# Patient Record
Sex: Female | Born: 1975
Health system: Southern US, Community
[De-identification: ages and names within clinical notes are randomized; demographics above are authoritative.]

---

## 2003-10-01 ENCOUNTER — Other Ambulatory Visit: Admission: RE | Admit: 2003-10-01 | Discharge: 2003-10-01 | Payer: Self-pay | Admitting: Obstetrics and Gynecology

## 2004-01-25 ENCOUNTER — Encounter (INDEPENDENT_AMBULATORY_CARE_PROVIDER_SITE_OTHER): Payer: Self-pay | Admitting: *Deleted

## 2004-01-25 ENCOUNTER — Ambulatory Visit (HOSPITAL_COMMUNITY): Admission: RE | Admit: 2004-01-25 | Discharge: 2004-01-25 | Payer: Self-pay | Admitting: Obstetrics and Gynecology

## 2004-04-02 ENCOUNTER — Other Ambulatory Visit: Admission: RE | Admit: 2004-04-02 | Discharge: 2004-04-02 | Payer: Self-pay | Admitting: Obstetrics and Gynecology

## 2004-12-12 ENCOUNTER — Other Ambulatory Visit: Admission: RE | Admit: 2004-12-12 | Discharge: 2004-12-12 | Payer: Self-pay | Admitting: Obstetrics and Gynecology

## 2005-08-10 ENCOUNTER — Inpatient Hospital Stay (HOSPITAL_COMMUNITY): Admission: RE | Admit: 2005-08-10 | Discharge: 2005-08-13 | Payer: Self-pay | Admitting: Obstetrics and Gynecology

## 2005-08-14 ENCOUNTER — Encounter: Admission: RE | Admit: 2005-08-14 | Discharge: 2005-08-26 | Payer: Self-pay | Admitting: Obstetrics and Gynecology

## 2006-07-09 ENCOUNTER — Encounter: Admission: RE | Admit: 2006-07-09 | Discharge: 2006-07-09 | Payer: Self-pay | Admitting: Family Medicine

## 2007-01-17 ENCOUNTER — Encounter: Admission: RE | Admit: 2007-01-17 | Discharge: 2007-01-17 | Payer: Self-pay | Admitting: Family Medicine

## 2010-08-13 ENCOUNTER — Encounter (HOSPITAL_COMMUNITY): Payer: 59

## 2010-08-13 ENCOUNTER — Ambulatory Visit (HOSPITAL_COMMUNITY)
Admission: RE | Admit: 2010-08-13 | Discharge: 2010-08-13 | Disposition: A | Discharge status: Home / Self Care | Payer: 59 | Source: Ambulatory Visit | Attending: Orthopedic Surgery | Admitting: Orthopedic Surgery

## 2010-08-13 DIAGNOSIS — Z01818 Encounter for other preprocedural examination: Secondary | ICD-10-CM | POA: Insufficient documentation

## 2010-08-13 DIAGNOSIS — M5126 Other intervertebral disc displacement, lumbar region: Secondary | ICD-10-CM | POA: Insufficient documentation

## 2010-08-13 LAB — PREGNANCY, URINE: Preg Test, Ur: NEGATIVE

## 2010-08-13 LAB — CBC
HCT: 37.8 % (ref 36.0–46.0)
Hemoglobin: 12.9 g/dL (ref 12.0–15.0)
MCH: 31.3 pg (ref 26.0–34.0)
MCV: 91.7 fL (ref 78.0–100.0)
RBC: 4.12 MIL/uL (ref 3.87–5.11)

## 2010-08-13 LAB — COMPREHENSIVE METABOLIC PANEL
AST: 17 U/L (ref 0–37)
Albumin: 3.6 g/dL (ref 3.5–5.2)
Alkaline Phosphatase: 54 U/L (ref 39–117)
CO2: 25 mEq/L (ref 19–32)
Chloride: 103 mEq/L (ref 96–112)
Potassium: 3.9 mEq/L (ref 3.5–5.1)

## 2010-08-13 LAB — SURGICAL PCR SCREEN: Staphylococcus aureus: NEGATIVE

## 2010-08-13 LAB — DIFFERENTIAL
Eosinophils Absolute: 0.2 10*3/uL (ref 0.0–0.7)
Eosinophils Relative: 2 % (ref 0–5)
Lymphocytes Relative: 21 % (ref 12–46)
Monocytes Absolute: 0.5 10*3/uL (ref 0.1–1.0)
Neutro Abs: 4.9 10*3/uL (ref 1.7–7.7)

## 2010-08-13 LAB — URINALYSIS, ROUTINE W REFLEX MICROSCOPIC
Bilirubin Urine: NEGATIVE
Ketones, ur: NEGATIVE mg/dL
Protein, ur: NEGATIVE mg/dL
Specific Gravity, Urine: 1.008 (ref 1.005–1.030)
Urine Glucose, Fasting: NEGATIVE mg/dL
Urobilinogen, UA: 0.2 mg/dL (ref 0.0–1.0)

## 2010-08-14 ENCOUNTER — Other Ambulatory Visit: Payer: Self-pay | Admitting: Obstetrics and Gynecology

## 2010-08-20 ENCOUNTER — Observation Stay (HOSPITAL_COMMUNITY)
Admission: RE | Admit: 2010-08-20 | Discharge: 2010-08-21 | Disposition: A | Discharge status: Home / Self Care | Payer: 59 | Attending: Orthopedic Surgery | Admitting: Orthopedic Surgery

## 2010-08-20 ENCOUNTER — Other Ambulatory Visit (HOSPITAL_COMMUNITY): Payer: Self-pay | Admitting: Orthopedic Surgery

## 2010-08-20 ENCOUNTER — Ambulatory Visit (HOSPITAL_COMMUNITY)
Admission: RE | Admit: 2010-08-20 | Discharge: 2010-08-20 | Disposition: A | Discharge status: Home / Self Care | Payer: 59 | Source: Ambulatory Visit | Attending: Orthopedic Surgery | Admitting: Orthopedic Surgery

## 2010-08-20 ENCOUNTER — Other Ambulatory Visit: Payer: Self-pay | Admitting: Orthopedic Surgery

## 2010-08-20 DIAGNOSIS — M549 Dorsalgia, unspecified: Secondary | ICD-10-CM

## 2010-08-20 DIAGNOSIS — M545 Low back pain, unspecified: Secondary | ICD-10-CM | POA: Insufficient documentation

## 2010-08-20 DIAGNOSIS — M5126 Other intervertebral disc displacement, lumbar region: Principal | ICD-10-CM | POA: Insufficient documentation

## 2010-08-20 DIAGNOSIS — M48061 Spinal stenosis, lumbar region without neurogenic claudication: Secondary | ICD-10-CM | POA: Insufficient documentation

## 2010-08-20 LAB — ABO/RH: ABO/RH(D): A POS

## 2010-08-20 LAB — TYPE AND SCREEN: Antibody Screen: NEGATIVE

## 2010-08-26 NOTE — Op Note (Signed)
[name], [name]          ACCOUNT NO.:  [id number]  MEDICAL RECORD NO.:  [id number]          PATIENT TYPE:  OBV  LOCATION:  1343                          FACILITY:  [facility]  PHYSICIAN:  Georges Lynch. Shenica Holzheimer, M.D.DATE OF BIRTH:  Apr 06, 1976  DATE OF PROCEDURE:  08/20/2010 DATE OF DISCHARGE:                              OPERATIVE REPORT   SURGEON:  Georges Lynch. Darrelyn Hillock, M.D.  ASSISTANT:  Marlowe Kays, M.D.  PREOPERATIVE DIAGNOSIS:  Bilateral herniated lumbar disk at L5-S1 with recess stenosis at L5-S1 bilaterally.  She had severe right leg pain which finally converted the bilateral leg pain preop.  POSTOPERATIVE DIAGNOSIS:  Bilateral herniated lumbar disk at L5-S1 with recess stenosis at L5-S1 bilaterally.  She had severe right leg pain which finally converted the bilateral leg pain preop.  OPERATION: 1. Bilateral decompression of the lateral recess of L5-S1. 2. Bilateral microdiskectomy at L5-S1.  PROCEDURE:  Under general anesthesia, routine orthopedic prep and draping of lower back was carried out.  Note, I did mark the right side of her back preop because that is where most of her symptoms were.  In the operating room prior to surgery, we did do the appropriate time-out. After sterile prep and drape was carried out with the patient on the spinal frame and the x-rays were up as well as the MRI, I had put 2 needles on the back for localization purposes.  At that time, we identified the L5-S1 space.  An incision was made over L5-S1.  Bleeders identified and cauterized.  The incision then was carried down to the lumbodorsal fascia.  We separated the muscle from the lamina and spinous processes bilaterally.  The McCullough retractor was inserted, another x- ray was taken.  Following that, we did bilateral hemilaminectomies with a microscope in place.  We went out first and removed the ligamentum flavum with great care taken to protect the underlying dura and nerve root.  We  went out in the lateral recess bilaterally, decompressed the recess because of the large size of the disk rupture.  We cauterized lateral recess veins.  We first went on the right side, D'Errico retractor was inserted and we gently retracted the S1 root.  We went down, made a cruciate incision in the posterior longitudinal ligament. The disk space was narrow.  We then did a microdiskectomy where large pieces of disk were removed subligamentous as well as from the space, went out far laterally as well.  We did a nice foraminotomy.  The S1 root now was free.  We then went over to the left side and did exact same procedure.  We made sure that the recess was decompressed.  We cauterized lateral recess veins and then made a cruciate incision in the posterior longitudinal ligament and went down and removed a large fragment which was subligamentous from the midline as well.  Note, we thoroughly cleaned out the space on the left side and then, we irrigated the area, loosely applied some thrombin-soaked Gelfoam after we made sure we had a nice foraminotomy and nerve root was very freely movable as well as the dura, there was no pressure on the root.  We then went back over the original right side to make sure we did not displace any disk there.  We went back in and removed some more small piece of disk which was subligamentous as well as cleaned out the space.  We made sure we had a nice foraminotomy and the S1 root now was quite free.  We were able to easily move the nerve about.  We did look in the axilla as well as the subligamentous space again to make sure there were no fragments proximally either on either side.  We thoroughly irrigated out the area, removed the fluid, loosely applied some thrombin- soaked Gelfoam, closed the wound layers usual fashion.  I left a small deep distal and proximal part of the wound open for drainage purposes. Subcu was closed with 0 Vicryl, skin was closed with  metal staples, and sterile Neosporin dressing was applied.  The patient had 1 g of IV Ancef preop.          ______________________________ Georges Lynch. Darrelyn Hillock, M.D.     RAG/MEDQ  D:  08/20/2010  T:  08/20/2010  Job:  [id number]  Electronically Signed by Ranee Gosselin M.D. on 08/26/2010 01:11:44 PM

## 2010-10-31 NOTE — Discharge Summary (Addendum)
  [name], [name]          ACCOUNT NO.:  [id number]  MEDICAL RECORD NO.:  [id number]           PATIENT TYPE:  O  LOCATION:  XRAY                         FACILITY:  [facility]  PHYSICIAN:  Georges Lynch. [name], M.D.DATE OF BIRTH:  October 30, 1975  DATE OF ADMISSION:  08/20/2010 DATE OF DISCHARGE:  08/20/2010                              DISCHARGE SUMMARY   ADMITTING DIAGNOSIS:  Bilateral lumbar disk herniation.  DISCHARGE DIAGNOSIS:  Bilateral lumbar disk herniation, status post decompression and microdiskectomy.  PROCEDURE:  [name] was admitted to [facility] on August 20, 2010.  She underwent bilateral decompression of lateral recess at L5-S1 and bilateral microdiskectomy at L5-S1.  The procedure was performed under general anesthesia.  Routine orthopedic prep and drape were carried out.  She received IV antibiotics preoperatively. There are no complications of the procedure.  The patient was returned to the recovery room in satisfactory condition.  LABORATORY DATA:  Her serum pregnancy test was negative.  HOSPITAL COURSE:  [name] was admitted to [facility] on August 20, 2010.  She underwent the above-stated procedure without complication.  After adequate time in the recovery room, she was then taken to the orthopedic floor for further recovery.  She was placed on reduced dose of Dilaudid, PCA as well as muscle relaxant for pain control.  Postoperative day #1, IV was discontinued as was Foley and PCA.  The patient was able to ambulate with therapy without difficulty, leg pain had resolved.  She was regaining strength in the lower extremities.  The patient was discharged home on August 21, 2010.  DISPOSITION:  To home on August 21, 2010.  MEDICATIONS ON DISCHARGE: 1. Docusate sodium 2. Methocarbamol. 3. Percocet. 4. Benadryl. 5. Robaxin.  ACTIVITY:  [name] will increase her activity slowly.  She is able to ambulate using the  walker.  She is able to shower.  DIET:  No restrictions.  WOUND CARE:  Daily dressing change.  FOLLOWUP:  She will follow up with Dr. Darrelyn Hillock in 2 weeks from the date of surgery.  She should contact the office at [phone] to schedule this appointment.  CONDITION ON DISCHARGE:  Improving.     Rozell Searing, PAC   ______________________________ Georges Lynch Darrelyn Hillock, M.D.    LD/MEDQ  D:  10/29/2010  T:  10/29/2010  Job:  [id number]  Electronically Signed by Rozell Searing  on 10/31/2010 03:14:27 PM Electronically Signed by Ranee Gosselin M.D. on 11/14/2010 06:46:51 AM

## 2010-11-12 NOTE — Discharge Summary (Addendum)
  [name], [name]          ACCOUNT NO.:  [id number]  MEDICAL RECORD NO.:  [id number]           PATIENT TYPE:  LOCATION:                                 FACILITY:  PHYSICIAN:  Georges Lynch. Yuniel Blaney, M.D.DATE OF BIRTH:  [birth year]  DATE OF ADMISSION: DATE OF DISCHARGE:                              DISCHARGE SUMMARY   ADMITTING DIAGNOSIS:  Bilateral herniated lumbar disk at L5-S1.  DISCHARGE DIAGNOSES:  Bilateral lumbar disk herniation L5-S1 status post decompression of lateral, recess and bilateral microdiskectomy.  PROCEDURE:  On August 20, 2010, [name] was taken to the operating room by surgeon Dr. Ranee Gosselin, assistant Dr. Marlowe Kays.  She underwent bilateral decompression of the lateral recess at L5-S1 as well as bilateral microdiskectomy at L5-S1.  The procedure was performed under general anesthesia.  Routine orthopedic prep and drape were carried out.  The patient received 1 g of IV Ancef preoperatively.  There were no complications with the procedure.  The patient was returned to the recovery room in satisfactory condition.  LABORATORY DATA:  The patient was negative for pregnancy and blood type is A+.  HOSPITAL COURSE:  [name] was admitted to [facility] on August 20, 2010.  She underwent the above-stated procedure without complications.  After spending adequate time in the recovery room, she was then transferred to the floor, where she was placed on reduced dose PCA Dilaudid and muscle relaxant for pain control.  On postoperative day #1, the patient was doing very well.  She was able to ambulate with physical therapy.  Dressing was dry.  Wound well approximated.  No signs of infection.  The patient discharged to home on postoperative day #1.  DISPOSITION:  To home on August 21, 2010.  MEDICATIONS ON DISCHARGE: 1. Percocet. 2. Robaxin. 3. Benadryl. 4. Karlva. 5. Tramadol, which she will discontinue.  ACTIVITY:  The  patient will increase her activity slowly.  She is able to ambulate using a walker and she is able to take shower.  DIET:  No restrictions.  WOUND CARE:  Daily dressing change.  She should cover the incision with saran wrap for the next 3 days, while showering to keep it dry.  FOLLOWUP:  She will follow up with Dr. Darrelyn Hillock 2 weeks from the day of surgery.  She should contact the office at [phone] to schedule this appointment.  CONDITION ON DISCHARGE:  Improving.     Rozell Searing, PAC   ______________________________ Georges Lynch Darrelyn Hillock, M.D.    LD/MEDQ  D:  11/07/2010  T:  11/07/2010  Job:  [id number]  Electronically Signed by Rozell Searing  on 11/12/2010 08:06:44 AM Electronically Signed by Ranee Gosselin M.D. on 11/14/2010 06:46:53 AM

## 2010-11-21 NOTE — Discharge Summary (Signed)
NAMEFLORINA, [name]          ACCOUNT NO.:  [id number]   MEDICAL RECORD NO.:  [id number]          PATIENT TYPE:  INP   LOCATION:  9146                          FACILITY:  [facility]   PHYSICIAN:  Randye Lobo, M.D.   DATE OF BIRTH:  [birth year]   DATE OF ADMISSION:  08/10/2005  DATE OF DISCHARGE:  08/13/2005                                 DISCHARGE SUMMARY   FINAL DIAGNOSES:  1.  Intrauterine pregnancy at 68 and six-sevenths weeks gestation.  2.  History of prior cesarean section, desires repeat cesarean section.   PROCEDURE:  Repeat low segment transverse cesarean section. Surgeon:  Dr.  Conley Simmonds. Complications:  None.   This 35 year old G4, P2-0-1-2 presents at 11 and six-sevenths weeks  gestation for repeat cesarean section. The patient had a history of a prior  cesarean section in 1996 and had a successful VBAC in 1999, but chose to  repeat a cesarean section this pregnancy. The patient's antepartum course at  this point had been complicated by a marginal placenta previa which resolved  during her pregnancy. Otherwise, the patient's antepartum course had been  benign. She had a negative group B strep culture obtained in the office at  36 weeks. She is taken the operating room on August 10, 2005, by Dr. Conley Simmonds where a repeat low transverse cesarean section was performed with the  delivery of a 7-pound 13-ounce female infant with Apgars of 9 and 9.  Delivery went without complications. The patient's postoperative course was  benign without any significant fevers. She did have some postoperative  anemia and will be sent home on an iron supplement. She was felt ready for  discharge on postoperative day #3. She was sent home on a regular diet, told  to decrease activities, told to continue her prenatal vitamins and her iron  supplement twice daily, was given Percocet one to two every 4-6 hours as  needed for pain, told she could use over-the-counter ibuprofen up to 600 mg  every 6 hours as needed for pain, was to follow up in the office in 4 weeks.   LABORATORY ON DISCHARGE:  The patient had a hemoglobin of 9.6; white blood  cell count of 8.4; and platelets of 182,000.      Leilani Able, P.A.-C.      Randye Lobo, M.D.  Electronically Signed    MB/MEDQ  D:  09/03/2005  T:  09/03/2005  Job:  [id number]

## 2010-11-21 NOTE — Op Note (Signed)
[name], [name]          ACCOUNT NO.:  [id number]   MEDICAL RECORD NO.:  [id number]          PATIENT TYPE:  INP   LOCATION:  9146                          FACILITY:  [facility]   PHYSICIAN:  Randye Lobo, M.D.   DATE OF BIRTH:  04/26/1976   DATE OF PROCEDURE:  08/10/2005  DATE OF DISCHARGE:                                 OPERATIVE REPORT   PREOPERATIVE DIAGNOSES:  1.  History of prior cesarean section.  2.  Desires repeat cesarean section.  3.  Intrauterine pregnancy at 38 +6 weeks.   PREOPERATIVE DIAGNOSES:  1.  History of prior cesarean section.  2.  Desires repeat cesarean section.  3.  Intrauterine gestation at 38 +6 weeks.   PROCEDURE:  Repeat low segment transverse cesarean section.   SURGEON:  Conley Simmonds, M.D.   ANESTHESIA:  Spinal.   IV FLUIDS:  2800 mL Ringer's lactate.   ESTIMATED BLOOD LOSS:  600 mL.   URINE OUTPUT:  400 mL.   COMPLICATIONS:  None.   INDICATIONS FOR PROCEDURE:  The patient is a 35 year old gravida 4, para 2-0-  1-2, Caucasian female at 37 +6 weeks' gestation by last menstrual period and  first trimester ultrasound, who had a history of a prior cesarean section in  1996 for failure to progress and then a history of a successful VBAC in  1999, who after counseling declined a VBAC and instead chose to repeat with  a cesarean section.  The patient's antepartum course was significant for a  marginal placenta previa, which resolved during the pregnancy.  A plan is  made now to proceed with a repeat low segment transverse cesarean section  after risks, benefits, and alternatives are discussed.   FINDINGS:  A viable female was delivered at 12:05 p.m.[name]  There was one  nuchal cord.  Apgars were 9 at one minute and 9 at five minutes.  The weight  was 7 pounds 13 ounces.  Amniotic fluid was clear.  The placenta had a  normal insertion of a three-vessel cord.  The uterus, tubes and ovaries were  normal.   SPECIMENS:  None.   PROCEDURE:  The  the patient was reidentified in the preoperative hold area.  She was taken down to the operating room, where a spinal anesthetic was  administered.  The patient was then placed in the supine position with a  left lateral tilt.  The abdomen was sterilely prepped and a Foley catheter  was placed in the bladder.  She was sterilely draped.   A Pfannenstiel incision was created along the line of the patient's previous  incision.  The incision was carried down to the level of the fascia using a  scalpel.  Monopolar cautery was used to create hemostasis in the  subcutaneous layer.  The fascia was then incised sharply in the midline and  the incision was extended bilaterally with a Mayo scissors.  The rectus  muscles were dissected off the overlying fascia superiorly and inferiorly.  The rectus muscles were bluntly separated in the midline.  The parietal  peritoneum was elevated with hemostat clamp and was entered sharply.  The  peritoneal incision was then extended cranially and caudally.   The bladder retractor was used to expose the lower uterine segment and the  bladder flap was sharply created with the Metzenbaum scissors.  A transverse  lower uterine segment incision was then created with a scalpel.  Membranes  were ruptured and clear fluid was noted.  The uterine incision was extended  bluntly.  A hand was inserted through the incision and the vertex was  delivered without difficulty.  The nares and mouth were suctioned.  The  nuchal cord was reduced.  The remainder of the infant was delivered.   The cord was doubly clamped and cut, and the newborn was carried over to the  awaiting pediatricians in vigorous condition.  Cord blood was then obtained  and the placenta was manually extracted and sent to the lab for cord blood  donation.  The uterus was exteriorized for its closure and a moistened lap  pad was used to wipe the remaining uterine cavity clean.  The uterine  incision was closed  with a double-layer closure of #1 chromic.  The first  was a running locked layer and the second was an imbricating layer.   The uterus was returned to the peritoneal cavity, which was irrigated and  suctioned at this time.  There was an area of bleeding which was noted in  the midportion of the incision, which was reinforced with a horizontal  mattress suture of #1 chromic.  This created good hemostasis.   The abdomen was closed at this time.  The parietal peritoneum was closed  with a running suture of 3-0 Vicryl.  The rectus muscles were reapproximated  using interrupted sutures of #1 chromic.  The fascia was closed with a  running suture of 0 Vicryl.  The subcutaneous tissue was irrigated and  suctioned and made hemostatic with monopolar cautery.  There was a small  area of bleeding along the fascial closure which was appreciated at this  time, and this was reinforced with a figure-of-eight suture of 3-0 chromic,  which created good hemostasis.  The skin was then closed with staples and  sterile bandage was placed over the incision after the remaining Betadine  was cleansed from the patient's abdomen.   This concluded the patient's surgery.  There were no complications.  All  needle, instrument, sponge counts were correct.      Randye Lobo, M.D.  Electronically Signed     BES/MEDQ  D:  08/10/2005  T:  08/10/2005  Job:  [id number]

## 2010-11-21 NOTE — Op Note (Signed)
[name], [name]                      ACCOUNT NO.:  [id number]   MEDICAL RECORD NO.:  [id number]                   PATIENT TYPE:  AMB   LOCATION:  [facility]                                  FACILITY:  [facility]   PHYSICIAN:  Randye Lobo, M.D.                DATE OF BIRTH:  [birth year]   DATE OF PROCEDURE:  01/25/2004  DATE OF DISCHARGE:  01/25/2004                                 OPERATIVE REPORT   PREOPERATIVE DIAGNOSIS:  Missed abortion.   POSTOPERATIVE DIAGNOSIS:  Missed abortion.   OPERATION PERFORMED:  Dilation and evacuation.   SURGEON:  Randye Lobo, M.D.   ANESTHESIA:  MAC, paracervical block.   INTRAVENOUS FLUIDS:  700 mL Ringers lactate.   ESTIMATED BLOOD LOSS:  Minimal.   URINE OUTPUT:  75 mL by I&O catheterization.   COMPLICATIONS:  None.   INDICATIONS FOR PROCEDURE:  The patient is a 35 year old gravida 3, para 2-0-  0-2 Caucasian at 10+[redacted] weeks gestation who presented to the office on January 24, 2004 for a routine office visit and was noted to have absent fetal heart  tones with a Doppler.  Both transabdominal and transvaginal sonography which  documented the absence of fetal cardiac activity.  The crown-rump length was  consistent with eight weeks gestation.  The patient had no evidence of  cramping or bleeding.  The patient was noted to be RH positive.  She was  given a diagnosis of a missed abortion and plan was made to proceed with  dilation and evacuation after risks, benefits and alternatives were  discussed with her.   FINDINGS:  Examination under anesthesia revealed a 9 weeks size retroverted  uterus.  No adnexal masses were appreciated.  A small amount of products of  conception were obtained.   SPECIMENS:  Products of conception were sent to pathology.   DESCRIPTION OF PROCEDURE:  The patient was reidentified in the preop hold  area.  The patient was then brought back to the operating room where she was  placed in the dorsal lithotomy position.   MAC anesthesia was induced and the  patient was sterilely prepped and draped and the bladder was catheterized of  urine.  An examination under anesthesia was performed and the findings were  as noted above.   A single toothed tenaculum was placed on the anterior cervical lip and a  paracervical block was performed with a total of 10 mL of 1% lidocaine.  The  uterus was sounded to 9 cm and it was noted to be retroverted.  The cervix  was then dilated to a number 25 Pratt dilator. A #9 suction tip curet was  then introduced through the cervical os to the level of the uterine fundus  and withdrawn slightly.  Appropriate suction was applied and then the  suction tip curet was withdrawn from the uterine cavity while turning it in  a clockwise fashion.  This was repeated  an additional time.  Sharp curettage  was then performed in all four quadrants of the uterus and the suction tip  curet was introduced into the uterine cavity two additional times to remove  any remaining blood clots.   The specimen was examined and a small amount of tissue was appreciated, and  sharp curettage was performed again with both the serrated and the smooth  curette to assure that no remaining products of conception remained.  The  uterus had a characteristically gritty texture to it in all four quadrants  and the procedure was completed.  No additional products of conception were  obtained.  Hemostasis was noted to be excellent.  The vaginal instruments  were all removed.  The patient was cleansed of any remaining Betadine and  she was then escorted to the recovery room in stable and awake condition.   There were no complications to the procedure.  All sponge, needle and  instrument counts were correct.                                               Randye Lobo, M.D.    BES/MEDQ  D:  01/25/2004  T:  01/27/2004  Job:  [id number]

## 2012-05-11 IMAGING — CR DG LUMBAR SPINE 2-3V
2 series · 2 of 2 positions shown · non-contrast
Comparison: None.

CLINICAL DATA: Preop low back pain.

LUMBAR SPINE - 2-3 VIEW

[t l-spine a.p.]
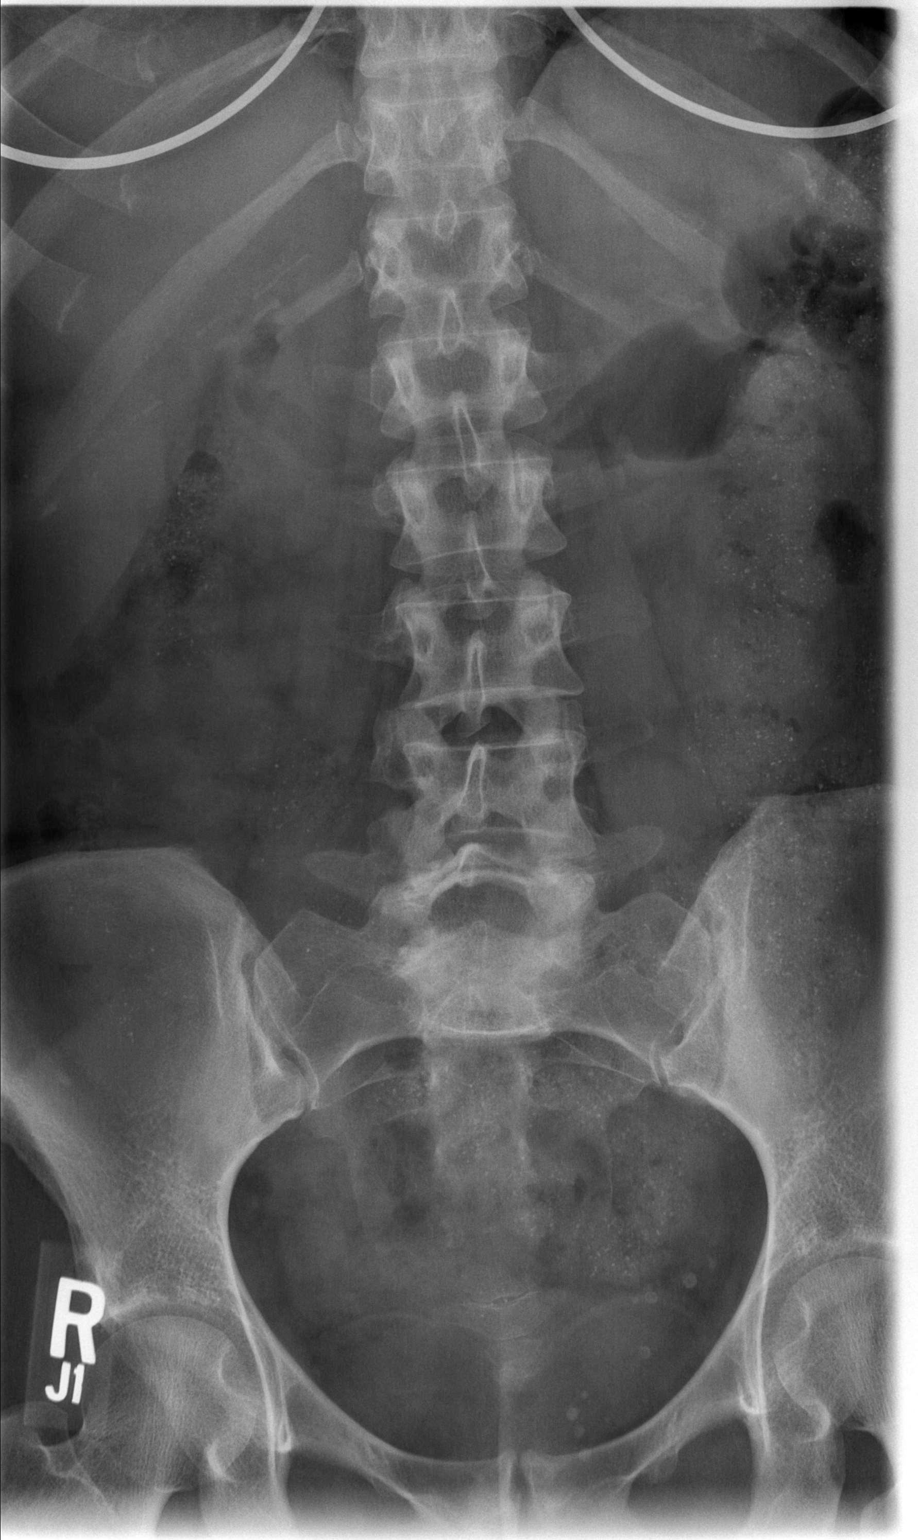

[t l-spine lat]
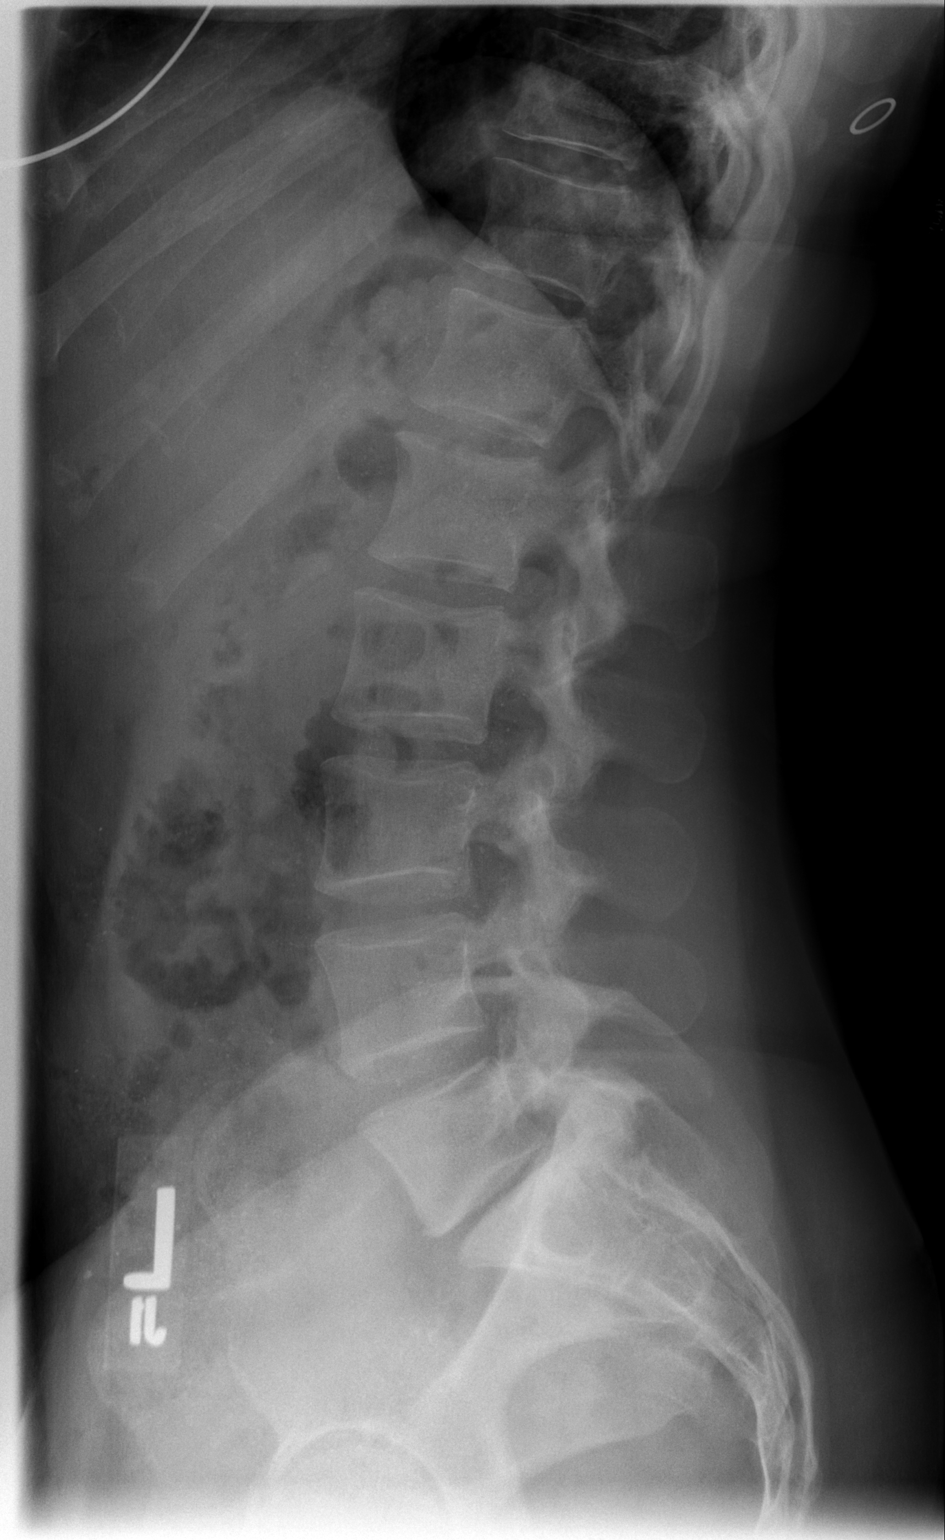

[2 of 2 positions shown; findings below may reference images not displayed]

FINDINGS: Alignment is anatomic.  Vertebral body height is
maintained.  Mild endplate degenerative changes, facet hypertrophy
and loss of disc space height are seen at L5-S1.
IMPRESSION: Mild L5-S1 spondylosis.

## 2012-05-18 IMAGING — CR DG SPINE 1V PORT
1 series · 1 of 1 positions shown · non-contrast
Comparison: Multiple priors

CLINICAL DATA: Low back pain

LUMBAR  SPINE - 1 VIEW

[AP]
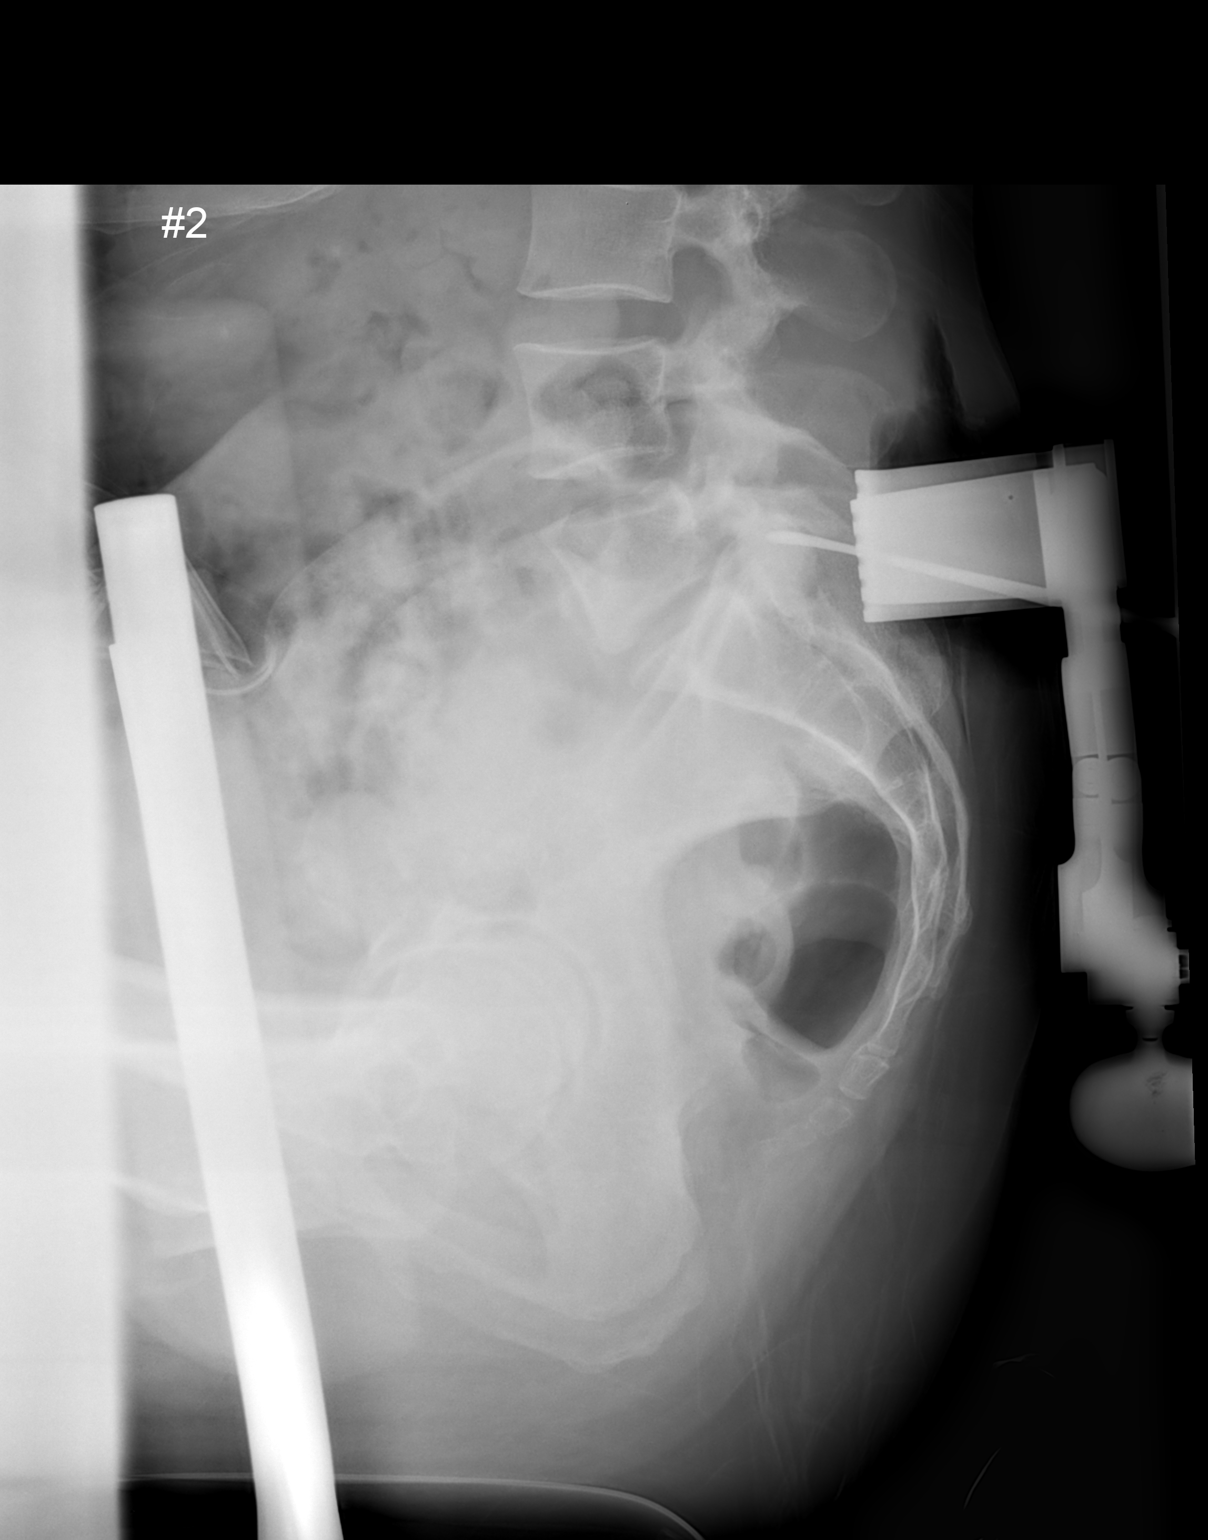

[1 of 1 positions shown; findings below may reference images not displayed]

FINDINGS: Portable film #2 reveals a probe from a posterior
approach directed most closely toward the L5-S1 interspace.
IMPRESSION: L5-S1 marked.

## 2015-02-20 ENCOUNTER — Other Ambulatory Visit: Payer: Self-pay | Admitting: Family Medicine

## 2015-02-20 ENCOUNTER — Other Ambulatory Visit (HOSPITAL_COMMUNITY)
Admission: RE | Admit: 2015-02-20 | Discharge: 2015-02-20 | Disposition: A | Discharge status: Home / Self Care | Payer: 59 | Source: Ambulatory Visit | Attending: Family Medicine | Admitting: Family Medicine

## 2015-02-20 DIAGNOSIS — Z1151 Encounter for screening for human papillomavirus (HPV): Secondary | ICD-10-CM | POA: Diagnosis present

## 2015-02-20 DIAGNOSIS — Z01411 Encounter for gynecological examination (general) (routine) with abnormal findings: Secondary | ICD-10-CM | POA: Insufficient documentation

## 2015-02-22 LAB — CYTOLOGY - PAP

## 2015-04-24 ENCOUNTER — Other Ambulatory Visit: Payer: Self-pay

## 2015-04-24 DIAGNOSIS — Z1231 Encounter for screening mammogram for malignant neoplasm of breast: Secondary | ICD-10-CM

## 2015-04-29 ENCOUNTER — Ambulatory Visit: Payer: 59

## 2015-05-14 ENCOUNTER — Ambulatory Visit
Admission: RE | Admit: 2015-05-14 | Discharge: 2015-05-14 | Disposition: A | Discharge status: Home / Self Care | Payer: BLUE CROSS/BLUE SHIELD | Source: Ambulatory Visit

## 2015-05-14 DIAGNOSIS — Z1231 Encounter for screening mammogram for malignant neoplasm of breast: Secondary | ICD-10-CM

## 2015-05-16 ENCOUNTER — Other Ambulatory Visit: Payer: Self-pay | Admitting: Family Medicine

## 2015-05-16 DIAGNOSIS — R928 Other abnormal and inconclusive findings on diagnostic imaging of breast: Secondary | ICD-10-CM

## 2015-05-23 ENCOUNTER — Ambulatory Visit
Admission: RE | Admit: 2015-05-23 | Discharge: 2015-05-23 | Disposition: A | Discharge status: Home / Self Care | Payer: BLUE CROSS/BLUE SHIELD | Source: Ambulatory Visit | Attending: Family Medicine | Admitting: Family Medicine

## 2015-05-23 DIAGNOSIS — R928 Other abnormal and inconclusive findings on diagnostic imaging of breast: Secondary | ICD-10-CM

## 2016-01-09 ENCOUNTER — Other Ambulatory Visit: Payer: Self-pay | Admitting: Family Medicine

## 2016-01-09 DIAGNOSIS — R921 Mammographic calcification found on diagnostic imaging of breast: Secondary | ICD-10-CM

## 2016-01-13 ENCOUNTER — Ambulatory Visit
Admission: RE | Admit: 2016-01-13 | Discharge: 2016-01-13 | Disposition: A | Discharge status: Home / Self Care | Payer: BLUE CROSS/BLUE SHIELD | Source: Ambulatory Visit | Attending: Family Medicine | Admitting: Family Medicine

## 2016-01-13 ENCOUNTER — Other Ambulatory Visit: Payer: Self-pay | Admitting: Family Medicine

## 2016-01-13 DIAGNOSIS — R921 Mammographic calcification found on diagnostic imaging of breast: Secondary | ICD-10-CM

## 2016-12-03 DIAGNOSIS — J0100 Acute maxillary sinusitis, unspecified: Secondary | ICD-10-CM | POA: Diagnosis not present

## 2016-12-15 DIAGNOSIS — F4323 Adjustment disorder with mixed anxiety and depressed mood: Secondary | ICD-10-CM | POA: Diagnosis not present

## 2016-12-16 DIAGNOSIS — Z309 Encounter for contraceptive management, unspecified: Secondary | ICD-10-CM | POA: Diagnosis not present

## 2017-02-22 DIAGNOSIS — F411 Generalized anxiety disorder: Secondary | ICD-10-CM | POA: Diagnosis not present

## 2018-05-02 DIAGNOSIS — F411 Generalized anxiety disorder: Secondary | ICD-10-CM | POA: Diagnosis not present

## 2018-05-02 DIAGNOSIS — Z23 Encounter for immunization: Secondary | ICD-10-CM | POA: Diagnosis not present

## 2018-05-02 DIAGNOSIS — Z3041 Encounter for surveillance of contraceptive pills: Secondary | ICD-10-CM | POA: Diagnosis not present

## 2018-05-02 DIAGNOSIS — J0100 Acute maxillary sinusitis, unspecified: Secondary | ICD-10-CM | POA: Diagnosis not present

## 2019-01-23 DIAGNOSIS — J019 Acute sinusitis, unspecified: Secondary | ICD-10-CM | POA: Diagnosis not present

## 2019-03-13 ENCOUNTER — Encounter (HOSPITAL_COMMUNITY): Payer: Self-pay

## 2019-03-13 ENCOUNTER — Ambulatory Visit (HOSPITAL_COMMUNITY)
Admission: EM | Admit: 2019-03-13 | Discharge: 2019-03-13 | Disposition: A | Discharge status: Home / Self Care | Payer: BC Managed Care – PPO

## 2019-03-13 ENCOUNTER — Other Ambulatory Visit: Payer: Self-pay

## 2019-03-13 DIAGNOSIS — S61209A Unspecified open wound of unspecified finger without damage to nail, initial encounter: Secondary | ICD-10-CM | POA: Diagnosis not present

## 2019-03-13 MED ORDER — BACITRACIN ZINC 500 UNIT/GM EX OINT
TOPICAL_OINTMENT | CUTANEOUS | Status: AC
  Filled 2019-03-13: qty 28.35

## 2019-03-13 NOTE — Discharge Instructions (Addendum)
Keep your wound clean and dry.  Wash it twice a day with soap and water, then apply antibiotic ointment and a bandage.    Return here or follow-up with your primary care provider if you note signs of infection: including redness, pus-like drainage, increased pain, warmth, red streaks, fever, or chills.

## 2019-03-13 NOTE — ED Provider Notes (Signed)
[facility]    CSN: [id number] Arrival date & time: 03/13/19  1403      History   Chief Complaint Chief Complaint  Patient presents with  . Finger Injury    HPI [name] is a 43 y.o. female.   Patient presents with laceration of her left small finger which occurred 4 hours PTA.  She states she was cutting chicken when she cut the tip of her finger off.  Bleeding controlled with pressure.  Patient denies other injury or other symptoms.  Last tetanus: 1 year.  LMP: current.   The history is provided by the patient.    History reviewed. No pertinent past medical history.  There are no active problems to display for this patient.   History reviewed. No pertinent surgical history.  OB History   No obstetric history on file.      Home Medications    Prior to Admission medications   Medication Sig Start Date End Date Taking? Authorizing Provider  VIORELE 0.15-0.02/0.01 MG (21/5) tablet TK 1 T PO QD 01/01/19   [provider]    Family History Family History  Problem Relation Age of Onset  . Cancer Mother     Social History Social History   Tobacco Use  . Smoking status: Never Smoker  . Smokeless tobacco: Never Used  Substance Use Topics  . Alcohol use: Yes  . Drug use: Never     Allergies   Patient has no allergy information on record.   Review of Systems Review of Systems  Constitutional: Negative for chills and fever.  HENT: Negative for ear pain and sore throat.   Eyes: Negative for pain and visual disturbance.  Respiratory: Negative for cough and shortness of breath.   Cardiovascular: Negative for chest pain and palpitations.  Gastrointestinal: Negative for abdominal pain and vomiting.  Genitourinary: Negative for dysuria and hematuria.  Musculoskeletal: Negative for arthralgias and back pain.  Skin: Positive for wound. Negative for color change and rash.  Neurological: Negative for seizures and syncope.  All other  systems reviewed and are negative.    Physical Exam Triage Vital Signs ED Triage Vitals  Enc Vitals Group     BP 03/13/19 1521 131/76     Pulse Rate 03/13/19 1521 86     Resp 03/13/19 1521 16     Temp 03/13/19 1521 97.8 F (36.6 C)     Temp Source 03/13/19 1521 Oral     SpO2 03/13/19 1521 97 %     Weight --      Height --      Head Circumference --      Peak Flow --      Pain Score 03/13/19 1518 5     Pain Loc --      Pain Edu? --      Excl. in GC? --    No data found.  Updated Vital Signs BP 131/76 (BP Location: Left Arm)   Pulse 86   Temp 97.8 F (36.6 C) (Oral)   Resp 16   SpO2 97%   Visual Acuity Right Eye Distance:   Left Eye Distance:   Bilateral Distance:    Right Eye Near:   Left Eye Near:    Bilateral Near:     Physical Exam Vitals signs and nursing note reviewed.  Constitutional:      General: She is not in acute distress.    Appearance: She is well-developed.  HENT:     Head:  Normocephalic and atraumatic.  Eyes:     Conjunctiva/sclera: Conjunctivae normal.  Neck:     Musculoskeletal: Neck supple.  Cardiovascular:     Rate and Rhythm: Normal rate and regular rhythm.     Heart sounds: No murmur.  Pulmonary:     Effort: Pulmonary effort is normal. No respiratory distress.     Breath sounds: Normal breath sounds.  Abdominal:     Palpations: Abdomen is soft.     Tenderness: There is no abdominal tenderness.  Skin:    General: Skin is warm and dry.     Capillary Refill: Capillary refill takes less than 2 seconds.     Comments: Superficial avulsion of left small finger tip.  No active bleeding.  Neurological:     General: No focal deficit present.     Mental Status: She is alert and oriented to person, place, and time.     Sensory: No sensory deficit.     Motor: No weakness.      UC Treatments / Results  Labs (all labs ordered are listed, but only abnormal results are displayed) Labs Reviewed - No data to display  EKG   Radiology  No results found.  Procedures Procedures (including critical care time)  Medications Ordered in UC Medications  bacitracin 500 UNIT/GM ointment (has no administration in time range)    Initial Impression / Assessment and Plan / UC Course  I have reviewed the triage vital signs and the nursing notes.  Pertinent labs & imaging results that were available during my care of the patient were reviewed by me and considered in my medical decision making (see chart for details).    Superficial avulsion of the left small finger tip.  Bleeding stopped prior to arrival.  Tetanus up-to-date.  Wound cleaned and dressed with antibiotic ointment.  Wound care instructions and signs of infection discussed at length with patient.  Instructed her to return here or follow-up with her PCP if she notes signs of infection.  Patient agrees with plan of care.     Final Clinical Impressions(s) / UC Diagnoses   Final diagnoses:  Avulsion of finger tip, initial encounter     Discharge Instructions     Keep your wound clean and dry.  Wash it twice a day with soap and water, then apply antibiotic ointment and a bandage.    Return here or follow-up with your primary care provider if you note signs of infection: including redness, pus-like drainage, increased pain, warmth, red streaks, fever, or chills.        ED Prescriptions    None     Controlled Substance Prescriptions [place] Controlled Substance Registry consulted? Not Applicable   Sharion Balloon, NP 03/13/19 1559

## 2019-03-13 NOTE — ED Triage Notes (Signed)
Pt report she sliced the top of 1 of her finger in the left hand and she is bleeding not stopped.

## 2019-03-28 DIAGNOSIS — H00011 Hordeolum externum right upper eyelid: Secondary | ICD-10-CM | POA: Diagnosis not present

## 2019-07-20 DIAGNOSIS — Z20822 Contact with and (suspected) exposure to covid-19: Secondary | ICD-10-CM | POA: Diagnosis not present

## 2019-09-02 ENCOUNTER — Ambulatory Visit: Payer: BC Managed Care – PPO | Attending: Internal Medicine

## 2019-09-02 DIAGNOSIS — Z23 Encounter for immunization: Secondary | ICD-10-CM | POA: Insufficient documentation

## 2019-09-02 NOTE — Progress Notes (Signed)
   Covid-19 Vaccination Clinic  Name:  [name]    MRN: [id number] DOB: [birth year]  09/02/2019  [name] was observed post Covid-19 immunization for 15 minutes without incidence. She was provided with Vaccine Information Sheet and instruction to access the V-Safe system.   [name] was instructed to call 911 with any severe reactions post vaccine: [name] Difficulty breathing  . Swelling of your face and throat  . A fast heartbeat  . A bad rash all over your body  . Dizziness and weakness    Immunizations Administered    Name Date Dose VIS Date Route   Pfizer COVID-19 Vaccine 09/02/2019 10:42 AM 0.3 mL 06/16/2019 Intramuscular   Manufacturer: [employer], [employer]   Lot: [id number]   NDC: [address]-0

## 2019-09-23 ENCOUNTER — Ambulatory Visit: Payer: BC Managed Care – PPO | Attending: Internal Medicine

## 2019-09-23 DIAGNOSIS — Z23 Encounter for immunization: Secondary | ICD-10-CM

## 2019-09-23 NOTE — Progress Notes (Signed)
   Covid-19 Vaccination Clinic  Name:  [name]    MRN: [id number] DOB: Aug 18, 1975  09/23/2019  [name] was observed post Covid-19 immunization for 15 minutes without incident. She was provided with Vaccine Information Sheet and instruction to access the V-Safe system.   [name] was instructed to call 911 with any severe reactions post vaccine: [name] Difficulty breathing  . Swelling of face and throat  . A fast heartbeat  . A bad rash all over body  . Dizziness and weakness   Immunizations Administered    Name Date Dose VIS Date Route   Pfizer COVID-19 Vaccine 09/23/2019 12:05 PM 0.3 mL 06/16/2019 Intramuscular   Manufacturer: [employer], [employer]   Lot: [id number]   NDC: [address]-2

## 2019-09-27 ENCOUNTER — Ambulatory Visit: Payer: BC Managed Care – PPO

## 2020-02-19 ENCOUNTER — Other Ambulatory Visit: Payer: Self-pay | Admitting: Family Medicine

## 2020-02-19 ENCOUNTER — Other Ambulatory Visit (HOSPITAL_COMMUNITY)
Admission: RE | Admit: 2020-02-19 | Discharge: 2020-02-19 | Disposition: A | Discharge status: Home / Self Care | Payer: BC Managed Care – PPO | Source: Ambulatory Visit | Attending: Family Medicine | Admitting: Family Medicine

## 2020-02-19 DIAGNOSIS — Z5181 Encounter for therapeutic drug level monitoring: Secondary | ICD-10-CM | POA: Diagnosis not present

## 2020-02-19 DIAGNOSIS — Z01411 Encounter for gynecological examination (general) (routine) with abnormal findings: Secondary | ICD-10-CM | POA: Diagnosis not present

## 2020-02-19 DIAGNOSIS — Z13220 Encounter for screening for lipoid disorders: Secondary | ICD-10-CM | POA: Diagnosis not present

## 2020-02-19 DIAGNOSIS — Z124 Encounter for screening for malignant neoplasm of cervix: Secondary | ICD-10-CM | POA: Diagnosis not present

## 2020-02-19 DIAGNOSIS — Z0000 Encounter for general adult medical examination without abnormal findings: Secondary | ICD-10-CM | POA: Diagnosis not present

## 2020-02-20 LAB — CYTOLOGY - PAP
Comment: NEGATIVE
Diagnosis: NEGATIVE
High risk HPV: NEGATIVE

## 2020-07-03 DIAGNOSIS — Z20822 Contact with and (suspected) exposure to covid-19: Secondary | ICD-10-CM | POA: Diagnosis not present

## 2020-08-06 DEATH — deceased
